# Patient Record
Sex: Male | Born: 2000 | Race: White | Hispanic: No | Marital: Single | Smoking: Current every day smoker
Health system: Southern US, Community
[De-identification: ages and names within clinical notes are randomized; demographics above are authoritative.]

## PROBLEM LIST (undated history)

## (undated) DIAGNOSIS — F909 Attention-deficit hyperactivity disorder, unspecified type: Secondary | ICD-10-CM

---
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
## 2021-08-25 DIAGNOSIS — Z20822 Contact with and (suspected) exposure to covid-19: Secondary | ICD-10-CM | POA: Diagnosis not present

## 2021-08-25 DIAGNOSIS — J029 Acute pharyngitis, unspecified: Secondary | ICD-10-CM | POA: Diagnosis not present

## 2023-10-10 ENCOUNTER — Emergency Department (HOSPITAL_COMMUNITY)
Admission: EM | Admit: 2023-10-10 | Discharge: 2023-10-10 | Disposition: A | Discharge status: Home / Self Care | Payer: Self-pay | Attending: Emergency Medicine | Admitting: Emergency Medicine

## 2023-10-10 ENCOUNTER — Encounter (HOSPITAL_COMMUNITY): Payer: Self-pay

## 2023-10-10 ENCOUNTER — Other Ambulatory Visit: Payer: Self-pay

## 2023-10-10 DIAGNOSIS — R1033 Periumbilical pain: Secondary | ICD-10-CM | POA: Insufficient documentation

## 2023-10-10 DIAGNOSIS — R112 Nausea with vomiting, unspecified: Secondary | ICD-10-CM | POA: Insufficient documentation

## 2023-10-10 LAB — CBC
HCT: 41.4 % (ref 39.0–52.0)
Hemoglobin: 14.3 g/dL (ref 13.0–17.0)
MCH: 31.6 pg (ref 26.0–34.0)
MCHC: 34.5 g/dL (ref 30.0–36.0)
MCV: 91.4 fL (ref 80.0–100.0)
Platelets: 206 10*3/uL (ref 150–400)
RBC: 4.53 MIL/uL (ref 4.22–5.81)
RDW: 11.9 % (ref 11.5–15.5)
WBC: 7.9 10*3/uL (ref 4.0–10.5)
nRBC: 0 % (ref 0.0–0.2)

## 2023-10-10 LAB — COMPREHENSIVE METABOLIC PANEL
ALT: 13 U/L (ref 0–44)
AST: 18 U/L (ref 15–41)
Albumin: 4.4 g/dL (ref 3.5–5.0)
Alkaline Phosphatase: 48 U/L (ref 38–126)
Anion gap: 7 (ref 5–15)
BUN: 8 mg/dL (ref 6–20)
CO2: 27 mmol/L (ref 22–32)
Calcium: 8.9 mg/dL (ref 8.9–10.3)
Chloride: 101 mmol/L (ref 98–111)
Creatinine, Ser: 1.03 mg/dL (ref 0.61–1.24)
GFR, Estimated: >60 mL/min (ref >60)
Glucose, Bld: 107 mg/dL — ABNORMAL HIGH (ref 70–99)
Potassium: 3.8 mmol/L (ref 3.5–5.1)
Sodium: 135 mmol/L (ref 135–145)
Total Bilirubin: 1.3 mg/dL — ABNORMAL HIGH (ref 0.0–1.2)
Total Protein: 6.9 g/dL (ref 6.5–8.1)

## 2023-10-10 LAB — MAGNESIUM: Magnesium: 2.3 mg/dL (ref 1.7–2.4)

## 2023-10-10 LAB — LIPASE, BLOOD: Lipase: 44 U/L (ref 11–51)

## 2023-10-10 MED ORDER — ONDANSETRON HCL 4 MG/2ML IJ SOLN
4.0000 mg | Freq: Once | INTRAMUSCULAR | Status: AC
  Administered 2023-10-10: 4 mg via INTRAVENOUS
  Filled 2023-10-10: qty 2

## 2023-10-10 MED ORDER — LACTATED RINGERS IV BOLUS
1000.0000 mL | Freq: Once | INTRAVENOUS | Status: AC
  Administered 2023-10-10: 1000 mL via INTRAVENOUS

## 2023-10-10 MED ORDER — ONDANSETRON 4 MG PO TBDP: 4.0000 mg | ORAL_TABLET | Freq: Three times a day (TID) | ORAL | 0 refills | Status: DC | PRN

## 2023-10-10 NOTE — ED Triage Notes (Signed)
 Pt c/o RUQ abdominal pain with intermittent since vomiting since New Years. Pt states eating [employer] last night and has started vomiting multiple times since.

## 2023-10-10 NOTE — ED Provider Notes (Signed)
  EMERGENCY DEPARTMENT AT [facility] Provider Note   CSN: [id number] Arrival date & time: 10/10/23  0715     History  Chief Complaint  Patient presents with   Abdominal Pain    Billy Pearson is a 23 y.o. male.   Abdominal Pain Associated symptoms: nausea and vomiting   Patient presents for emesis.  Medical history includes ADHD.  2 weeks ago, patient had 3 days of nausea and vomiting.  He attributes this to food intake.  Symptoms improved and he has been in his normal state of health up until last night.  Last night, he ate some abdominals.  Afterwards, he developed nausea and vomiting.  He endorses a mild periumbilical pain.  Pain has not migrated.  He did have continued episodes of emesis throughout the night.  Emesis did subside this morning.  He endorses continued mild nausea.  He denies any history of abdominal surgeries.  Last bowel movement was last night and normal.  He denies fevers or chills.      Home Medications Prior to Admission medications   Medication Sig Start Date End Date Taking? Authorizing Provider  ondansetron  (ZOFRAN -ODT) 4 MG disintegrating tablet Take 1 tablet (4 mg total) by mouth every 8 (eight) hours as needed for nausea or vomiting. 10/10/23  Yes Melvenia Motto, MD  amoxicillin  (AMOXIL ) 500 MG capsule Take 1 capsule (500 mg total) by mouth 3 (three) times daily. 01/08/13   Armida Culver, PA-C  cetirizine -pseudoephedrine  (ZYRTEC -D) 5-120 MG tablet Take 1 tablet by mouth 2 (two) times daily. 12/15/15   Sofia, Leslie K, PA-C  methylphenidate (METADATE CD) 60 MG CR capsule Take 60 mg by mouth every morning.     [provider]  Pseudoeph-Doxylamine-DM-APAP (NYQUIL MULTI-SYMPTOM PO) Take 5 mLs by mouth once as needed (for symptoms).    [provider]      Allergies    Patient has no known allergies.    Review of Systems   Review of Systems  Gastrointestinal:  Positive for abdominal pain, nausea and vomiting.  All  other systems reviewed and are negative.   Physical Exam Updated Vital Signs BP (!) 145/83 (BP Location: Right Arm)   Pulse 62   Temp 98.1 F (36.7 C) (Oral)   Resp 18   Ht 5' 10 (1.778 m)   Wt 72.6 kg   SpO2 96%   BMI 22.97 kg/m  Physical Exam Vitals and nursing note reviewed.  Constitutional:      General: He is not in acute distress.    Appearance: He is well-developed. He is not ill-appearing, toxic-appearing or diaphoretic.  HENT:     Head: Normocephalic and atraumatic.     Mouth/Throat:     Mouth: Mucous membranes are moist.  Eyes:     Conjunctiva/sclera: Conjunctivae normal.  Cardiovascular:     Rate and Rhythm: Normal rate and regular rhythm.  Pulmonary:     Effort: Pulmonary effort is normal. No respiratory distress.  Abdominal:     Palpations: Abdomen is soft.     Tenderness: There is no abdominal tenderness.  Musculoskeletal:        General: No swelling.     Cervical back: Neck supple.  Skin:    General: Skin is warm and dry.     Capillary Refill: Capillary refill takes less than 2 seconds.  Neurological:     General: No focal deficit present.     Mental Status: He is alert and oriented to person, place, and  time.  Psychiatric:        Mood and Affect: Mood normal.        Behavior: Behavior normal.     ED Results / Procedures / Treatments   Labs (all labs ordered are listed, but only abnormal results are displayed) Labs Reviewed  COMPREHENSIVE METABOLIC PANEL - Abnormal; Notable for the following components:      Result Value   Glucose, Bld 107 (*)    Total Bilirubin 1.3 (*)    All other components within normal limits  LIPASE, BLOOD  CBC  MAGNESIUM  URINALYSIS, ROUTINE W REFLEX MICROSCOPIC  RAPID URINE DRUG SCREEN, HOSP PERFORMED    EKG None  Radiology No results found.  Procedures Procedures    Medications Ordered in ED Medications  lactated ringers  bolus 1,000 mL (1,000 mLs Intravenous Bolus 10/10/23 0818)  ondansetron  (ZOFRAN )  injection 4 mg (4 mg Intravenous Given 10/10/23 0818)    ED Course/ Medical Decision Making/ A&P                                 Medical Decision Making Amount and/or Complexity of Data Reviewed Labs: ordered.  Risk Prescription drug management.   This patient presents to the ED for concern of emesis, this involves an extensive number of treatment options, and is a complaint that carries with it a high risk of complications and morbidity.  The differential diagnosis includes enteritis, cyclic vomiting syndrome, GERD, intra-abdominal infection   Co morbidities that complicate the patient evaluation  N/A   Additional history obtained:  Additional history obtained from N/A External records from outside source obtained and reviewed including EMR   Lab Tests:  I Ordered, and personally interpreted labs.  The pertinent results include: Normal hemoglobin, no leukocytosis, normal kidney function, normal electrolytes, normal hepatobiliary enzymes, normal lipase  Problem List / ED Course / Critical interventions / Medication management  Patient presenting for nausea and vomiting since last night.  On arrival, patient is well-appearing.  Endorses some very mild periumbilical discomfort.  Pain has not migrated since last night.  He has some mild nausea but has not had any emesis in the last hour.  Abdomen is soft and nontender.  Laboratory workup was initiated.  IV fluids and Zofran  were ordered.  Lab results are reassuring.  Patient had resolution of abdominal discomfort and nausea while in the ED.  He was given food and drink, which he was able to tolerate without difficulty.  He was prescribed as needed Zofran .  He was discharged in stable condition. I ordered medication including IV fluids for hydration; Zofran  for nausea Reevaluation of the patient after these medicines showed that the patient resolved I have reviewed the patients home medicines and have made adjustments as  needed   Social Determinants of Health:  Lives independently         Final Clinical Impression(s) / ED Diagnoses Final diagnoses:  Nausea and vomiting, unspecified vomiting type    Rx / DC Orders ED Discharge Orders          Ordered    ondansetron  (ZOFRAN -ODT) 4 MG disintegrating tablet  Every 8 hours PRN        10/10/23 0949              Melvenia Motto, MD 10/10/23 [phone]

## 2023-10-10 NOTE — ED Notes (Signed)
 Pt given water for PO challenge per Dr. Durwin Nora.

## 2023-10-10 NOTE — Discharge Instructions (Signed)
 Stay hydrated.  A prescription for a medication called ondansetron was sent to your pharmacy.  Take this as needed for nausea.  Return to the emergency department for any new or worsening symptoms of concern.

## 2023-10-29 ENCOUNTER — Emergency Department (HOSPITAL_COMMUNITY): Payer: Self-pay

## 2023-10-29 ENCOUNTER — Encounter (HOSPITAL_COMMUNITY): Payer: Self-pay | Admitting: Emergency Medicine

## 2023-10-29 ENCOUNTER — Other Ambulatory Visit: Payer: Self-pay

## 2023-10-29 ENCOUNTER — Emergency Department (HOSPITAL_COMMUNITY)
Admission: EM | Admit: 2023-10-29 | Discharge: 2023-10-29 | Disposition: A | Discharge status: Home / Self Care | Payer: Self-pay | Attending: Emergency Medicine | Admitting: Emergency Medicine

## 2023-10-29 DIAGNOSIS — R112 Nausea with vomiting, unspecified: Secondary | ICD-10-CM | POA: Diagnosis present

## 2023-10-29 DIAGNOSIS — R109 Unspecified abdominal pain: Secondary | ICD-10-CM | POA: Insufficient documentation

## 2023-10-29 LAB — COMPREHENSIVE METABOLIC PANEL
ALT: 10 U/L (ref 0–44)
AST: 19 U/L (ref 15–41)
Albumin: 4.3 g/dL (ref 3.5–5.0)
Alkaline Phosphatase: 49 U/L (ref 38–126)
Anion gap: 11 (ref 5–15)
BUN: 11 mg/dL (ref 6–20)
CO2: 24 mmol/L (ref 22–32)
Calcium: 9.1 mg/dL (ref 8.9–10.3)
Chloride: 101 mmol/L (ref 98–111)
Creatinine, Ser: 1.01 mg/dL (ref 0.61–1.24)
GFR, Estimated: >60 mL/min (ref >60)
Glucose, Bld: 111 mg/dL — ABNORMAL HIGH (ref 70–99)
Potassium: 3.6 mmol/L (ref 3.5–5.1)
Sodium: 136 mmol/L (ref 135–145)
Total Bilirubin: 1.6 mg/dL — ABNORMAL HIGH (ref 0.0–1.2)
Total Protein: 7 g/dL (ref 6.5–8.1)

## 2023-10-29 LAB — URINALYSIS, ROUTINE W REFLEX MICROSCOPIC
Bilirubin Urine: NEGATIVE
Glucose, UA: NEGATIVE mg/dL
Hgb urine dipstick: NEGATIVE
Ketones, ur: NEGATIVE mg/dL
Leukocytes,Ua: NEGATIVE
Nitrite: NEGATIVE
Protein, ur: NEGATIVE mg/dL
Specific Gravity, Urine: 1.017 (ref 1.005–1.030)
pH: 6 (ref 5.0–8.0)

## 2023-10-29 LAB — CBC
HCT: 42.1 % (ref 39.0–52.0)
Hemoglobin: 14.7 g/dL (ref 13.0–17.0)
MCH: 31.6 pg (ref 26.0–34.0)
MCHC: 34.9 g/dL (ref 30.0–36.0)
MCV: 90.5 fL (ref 80.0–100.0)
Platelets: 204 10*3/uL (ref 150–400)
RBC: 4.65 MIL/uL (ref 4.22–5.81)
RDW: 12.1 % (ref 11.5–15.5)
WBC: 10.3 10*3/uL (ref 4.0–10.5)
nRBC: 0 % (ref 0.0–0.2)

## 2023-10-29 LAB — LIPASE, BLOOD: Lipase: 40 U/L (ref 11–51)

## 2023-10-29 MED ORDER — LACTATED RINGERS IV BOLUS
1000.0000 mL | Freq: Once | INTRAVENOUS | Status: AC
  Administered 2023-10-29: 1000 mL via INTRAVENOUS

## 2023-10-29 MED ORDER — PROMETHAZINE HCL 25 MG PO TABS: 25.0000 mg | ORAL_TABLET | Freq: Four times a day (QID) | ORAL | 0 refills | Status: AC | PRN

## 2023-10-29 MED ORDER — ONDANSETRON 8 MG PO TBDP
8.0000 mg | ORAL_TABLET | Freq: Once | ORAL | Status: DC
  Filled 2023-10-29: qty 1

## 2023-10-29 MED ORDER — DROPERIDOL 2.5 MG/ML IJ SOLN
2.5000 mg | Freq: Once | INTRAMUSCULAR | Status: AC
  Administered 2023-10-29: 2.5 mg via INTRAVENOUS
  Filled 2023-10-29: qty 2

## 2023-10-29 MED ORDER — ONDANSETRON 8 MG PO TBDP: 8.0000 mg | ORAL_TABLET | Freq: Three times a day (TID) | ORAL | 0 refills | Status: AC | PRN

## 2023-10-29 NOTE — ED Notes (Signed)
Requested IV nausea medication for patient due to significant nausea and vomiting.

## 2023-10-29 NOTE — ED Triage Notes (Signed)
Pt reports emesis and abdominal pain starting last night/ early morning around 3am. Pt reports a similar event around 2 weeks ago. Everyone in the household has eaten the same foods and no other members are experiencing symptoms. Pt is grimacing and expressing verbalized pain as well as some guarding at triage. Pt reports pain 10/10

## 2023-10-29 NOTE — ED Provider Notes (Signed)
[place] EMERGENCY DEPARTMENT AT [facility] Provider Note   CSN: [id number] Arrival date & time: 10/29/23  0701     History  Chief Complaint  Patient presents with   Abdominal Pain   Emesis    Billy Pearson is a 23 y.o. male.  HPI 14-year-old male with nausea vomiting sharp abdominal pain.  Patient states the symptoms began about a month ago.  He was seen in the [facility] ED for similar symptoms about 2-1/2 weeks ago.  The symptoms began in the early morning hours.  He describes sharp abdominal pain with associated nausea and vomiting.  No fever, diarrhea.  Does endorse daily THC use     Home Medications Prior to Admission medications   Medication Sig Start Date End Date Taking? Authorizing Provider  ondansetron (ZOFRAN-ODT) 8 MG disintegrating tablet Take 1 tablet (8 mg total) by mouth every 8 (eight) hours as needed for nausea or vomiting. 10/29/23  Yes Margarita Grizzle, MD  promethazine (PHENERGAN) 25 MG tablet Take 1 tablet (25 mg total) by mouth every 6 (six) hours as needed for nausea or vomiting. 10/29/23  Yes Margarita Grizzle, MD  amoxicillin (AMOXIL) 500 MG capsule Take 1 capsule (500 mg total) by mouth 3 (three) times daily. 01/08/13   Ivery Quale, PA-C  cetirizine-pseudoephedrine (ZYRTEC-D) 5-120 MG tablet Take 1 tablet by mouth 2 (two) times daily. 12/15/15   Elson Areas, PA-C  methylphenidate (METADATE CD) 60 MG CR capsule Take 60 mg by mouth every morning.     [provider]  Pseudoeph-Doxylamine-DM-APAP (NYQUIL MULTI-SYMPTOM PO) Take 5 mLs by mouth once as needed (for symptoms).    [provider]      Allergies    Patient has no known allergies.    Review of Systems   Review of Systems  Physical Exam Updated Vital Signs BP (!) 158/93 (BP Location: Right Arm)   Pulse (!) 55   Temp 98.1 F (36.7 C) (Oral)   Resp 18   Ht 1.778 m (5\' 10" )   Wt 72 kg   SpO2 99%   BMI 22.78 kg/m  Physical Exam Vitals reviewed.  Neurological:      Mental Status: He is alert.     ED Results / Procedures / Treatments   Labs (all labs ordered are listed, but only abnormal results are displayed) Labs Reviewed  COMPREHENSIVE METABOLIC PANEL - Abnormal; Notable for the following components:      Result Value   Glucose, Bld 111 (*)    Total Bilirubin 1.6 (*)    All other components within normal limits  CBC  URINALYSIS, ROUTINE W REFLEX MICROSCOPIC  LIPASE, BLOOD    EKG None  Radiology DG Abdomen 1 View Result Date: 10/29/2023 CLINICAL DATA:  Abdominal pain EXAM: ABDOMEN - 1 VIEW COMPARISON:  None Available. FINDINGS: The bowel gas pattern is normal. No radio-opaque calculi or other significant radiographic abnormality are seen. IMPRESSION: Negative. Electronically Signed   By: Duanne Guess D.O.   On: 10/29/2023 09:25    Procedures Procedures    Medications Ordered in ED Medications  ondansetron (ZOFRAN-ODT) disintegrating tablet 8 mg (8 mg Oral Patient Refused/Not Given 10/29/23 0818)  lactated ringers bolus 1,000 mL (1,000 mLs Intravenous Bolus from Bag 10/29/23 0808)  droperidol (INAPSINE) 2.5 MG/ML injection 2.5 mg (2.5 mg Intravenous Given 10/29/23 7829)    ED Course/ Medical Decision Making/ A&P Clinical Course as of 10/29/23 1002  Sat Oct 29, 2023  5621 KUB reviewed interpreted and  shows no evidence of acute abnormality radiologist interpretation concurs [DR]  0939 CBC, c-Met, lipase reviewed interpreted and within normal limits [DR]  0939 Infection, glucose, or other acute abnormalities Urinalysis is negative for any signs of [DR]    Clinical Course User Index [DR] Margarita Grizzle, MD                                 Medical Decision Making Amount and/or Complexity of Data Reviewed Labs: ordered. Radiology: ordered.  Risk Prescription drug management.   23 year old male presents today with nausea and vomiting and crampy abdominal pain.  Patient has had this several times over the past month.  This is a  newer problem for him but he has been previously evaluated. Differential diagnosis is broad but is not limited to Gastritis/gastroenteritis/infectious etiology Gallstone/gallbladder/pancreatitis-out as normal lipase and LFTs without acute epigastric or right upper quadrant tenderness on exam Other acute surgical etiologies doubt as patient has none point tenderness on physical exam Small bowel obstruction-patient has no evidence of small bowel obstruction on KUB and has not had prior surgeries or other etiologies that would make him prone to this Cyclical vomiting syndrome-patient is daily THC user  Patient appears stable and does not appear to have any acute surgical etiologies or other etiologies that would require inpatient admission He is being hydrated and has had antiemetics.  He is somewhat improved. Plan to attempt p.o. fluid challenge Patient improved. Discussed cessation THC and alcohol use. Will prescribe Zofran and Phenergan for outpatient use.  We discussed return precautions and need for follow-up and patient voices understanding       Final Clinical Impression(s) / ED Diagnoses Final diagnoses:  Nausea and vomiting, unspecified vomiting type    Rx / DC Orders ED Discharge Orders          Ordered    ondansetron (ZOFRAN-ODT) 8 MG disintegrating tablet  Every 8 hours PRN        10/29/23 1002    promethazine (PHENERGAN) 25 MG tablet  Every 6 hours PRN        10/29/23 1002              Margarita Grizzle, MD 10/29/23 1002

## 2023-10-29 NOTE — ED Notes (Signed)
Pt refused ODT zofran due to significant nausea land vomiting.

## 2023-10-29 NOTE — Discharge Instructions (Signed)
Please advance your diet slowly beginning with clear liquids Avoid THC and alcohol use Please arrange outpatient follow-up with a primary care doctor Return to the emergency department if you are having new or worsening symptoms at any time
